# Patient Record
Sex: Female | Born: 1965 | Hispanic: No | Marital: Single | Smoking: Never smoker
Health system: Southern US, Community
[De-identification: ages and names within clinical notes are randomized; demographics above are authoritative.]

## PROBLEM LIST (undated history)

## (undated) DIAGNOSIS — M545 Low back pain, unspecified: Secondary | ICD-10-CM

## (undated) DIAGNOSIS — F419 Anxiety disorder, unspecified: Secondary | ICD-10-CM

## (undated) DIAGNOSIS — M542 Cervicalgia: Secondary | ICD-10-CM

## (undated) DIAGNOSIS — R7303 Prediabetes: Secondary | ICD-10-CM

## (undated) DIAGNOSIS — Z87440 Personal history of urinary (tract) infections: Secondary | ICD-10-CM

## (undated) DIAGNOSIS — R202 Paresthesia of skin: Secondary | ICD-10-CM

## (undated) DIAGNOSIS — I10 Essential (primary) hypertension: Secondary | ICD-10-CM

## (undated) DIAGNOSIS — I6529 Occlusion and stenosis of unspecified carotid artery: Secondary | ICD-10-CM

## (undated) DIAGNOSIS — R42 Dizziness and giddiness: Secondary | ICD-10-CM

## (undated) DIAGNOSIS — R200 Anesthesia of skin: Secondary | ICD-10-CM

## (undated) HISTORY — DX: Anesthesia of skin: R20.0

## (undated) HISTORY — DX: Anesthesia of skin: R20.2

## (undated) HISTORY — DX: Anxiety disorder, unspecified: F41.9

## (undated) HISTORY — DX: Low back pain, unspecified: M54.50

## (undated) HISTORY — DX: Cervicalgia: M54.2

## (undated) HISTORY — DX: Dizziness and giddiness: R42

## (undated) HISTORY — DX: Occlusion and stenosis of unspecified carotid artery: I65.29

## (undated) HISTORY — DX: Personal history of urinary (tract) infections: Z87.440

---

## 2001-10-23 ENCOUNTER — Other Ambulatory Visit: Admission: RE | Admit: 2001-10-23 | Discharge: 2001-10-23 | Payer: Self-pay | Admitting: Family Medicine

## 2002-05-21 ENCOUNTER — Encounter: Payer: Self-pay | Admitting: Family Medicine

## 2002-05-21 ENCOUNTER — Encounter: Admission: RE | Admit: 2002-05-21 | Discharge: 2002-05-21 | Payer: Self-pay | Admitting: Family Medicine

## 2005-01-05 ENCOUNTER — Other Ambulatory Visit: Admission: RE | Admit: 2005-01-05 | Discharge: 2005-01-05 | Payer: Self-pay | Admitting: Family Medicine

## 2007-06-13 ENCOUNTER — Other Ambulatory Visit: Admission: RE | Admit: 2007-06-13 | Discharge: 2007-06-13 | Payer: Self-pay | Admitting: Family Medicine

## 2010-03-27 ENCOUNTER — Ambulatory Visit
Admission: RE | Admit: 2010-03-27 | Discharge: 2010-03-27 | Payer: Self-pay | Source: Home / Self Care | Admitting: Emergency Medicine

## 2010-03-27 DIAGNOSIS — K089 Disorder of teeth and supporting structures, unspecified: Secondary | ICD-10-CM | POA: Insufficient documentation

## 2010-03-28 ENCOUNTER — Encounter: Payer: Self-pay | Admitting: Emergency Medicine

## 2010-04-15 NOTE — Assessment & Plan Note (Signed)
Summary: EXTREME TOOTHACHE/WSE   Vital Signs:  Patient Profile:   45 Years Old Female CC:      lower left tootache  x 4 days Height:     60 inches Weight:      156 pounds O2 Sat:      97 % O2 treatment:    Room Air Temp:     99.3 degrees F oral Pulse rate:   91 / minute Resp:     16 per minute BP sitting:   148 / 84  (left arm)  Pt. in pain?   yes    Location:   lower left teeth    Intensity:   7    Type:       aches/throb  Vitals Entered By: Lajean Saver RN (March 27, 2010 5:00 PM)                   Updated Prior Medication List: ADVIL 200 MG CAPS (IBUPROFEN) prn ZYRTEC ALLERGY 10 MG TABS (CETIRIZINE HCL) prn  Current Allergies: No known allergies History of Present Illness Chief Complaint: lower left tootache  x 4 days History of Present Illness: Lower left toothache for 4 days.  Has not seen a dentist but she has one.  Today got acutely worse.  Not trying any OTC meds.  No HA, F/C/N/V, drainage, pus.  No previous tooth pain like this.  Feels a little swollen to her.  Biting on that area or pressing it hurts the worst.  REVIEW OF SYSTEMS Constitutional Symptoms      Denies fever, chills, night sweats, weight loss, weight gain, and fatigue.  Eyes       Denies change in vision, eye pain, eye discharge, glasses, contact lenses, and eye surgery. Ear/Nose/Throat/Mouth       Complains of tooth pain or bleeding.      Denies hearing loss/aids, change in hearing, ear pain, ear discharge, dizziness, frequent runny nose, frequent nose bleeds, sinus problems, sore throat, and hoarseness.      Comments: lower left Respiratory       Denies dry cough, productive cough, wheezing, shortness of breath, asthma, bronchitis, and emphysema/COPD.  Cardiovascular       Denies murmurs, chest pain, and tires easily with exhertion.    Gastrointestinal       Denies stomach pain, nausea/vomiting, diarrhea, constipation, blood in bowel movements, and indigestion. Genitourniary  Denies painful urination, kidney stones, and loss of urinary control. Neurological       Denies paralysis, seizures, and fainting/blackouts. Musculoskeletal       Denies muscle pain, joint pain, joint stiffness, decreased range of motion, redness, swelling, muscle weakness, and gout.  Skin       Denies bruising, unusual mles/lumps or sores, and hair/skin or nail changes.  Psych       Denies mood changes, temper/anger issues, anxiety/stress, speech problems, depression, and sleep problems. Other Comments: Patient's pain has increased a lot since yesterday. Her dentist is closed on Fridays. She plans to make an appointment for next week   Past History:  Past Medical History: Unremarkable  Past Surgical History: Caesarean section  Family History: Family History Hypertension  Social History: Never Smoked Alcohol use-yes Drug use-no Smoking Status:  never Drug Use:  no Physical Exam General appearance: well developed, well nourished, no acute distress Ears: normal, no lesions or deformities Nasal: mucosa pink, nonedematous, no septal deviation, turbinates normal Oral/Pharynx: Teeth normal, no obvious abscess or lesion or broken tooth or swelling or bleeding  or drainage.  +TTP L lower 1st molar Neck: neck supple,  trachea midline, no masses Heart: regular rate and  rhythm, no murmur MSE: oriented to time, place, and person Assessment New Problems: DENTAL PAIN (ICD-525.9)   Patient Education: Patient and/or caregiver instructed in the following: rest.  Plan New Medications/Changes: VICODIN 5-500 MG TABS (HYDROCODONE-ACETAMINOPHEN) 1 by mouth at bedtime as needed severe pain  #5 x 0, 03/27/2010, Hoyt Koch MD ULTRACET 37.5-325 MG TABS (TRAMADOL-ACETAMINOPHEN) 1 by mouth q6 hrs as needed for pain  #20 x 0, 03/27/2010, Hoyt Koch MD PENICILLIN V POTASSIUM 500 MG TABS (PENICILLIN V POTASSIUM) 1 by mouth three times a day for 1 week  #21 x 0, 03/27/2010, Hoyt Koch MD  New Orders: New Patient Level III [phone] Planning Comments:   Will treat as possible dental abscess.  Take PCN VK and Ultracet.  A few Vicodin only for nighttime pain.  Avoid hot or cold or spicy foods/liquids.  Call your dentist on Monday to make an appointment to be checked out. If much more severe pain, go to ER.   The patient and/or caregiver has been counseled thoroughly with regard to medications prescribed including dosage, schedule, interactions, rationale for use, and possible side effects and they verbalize understanding.  Diagnoses and expected course of recovery discussed and will return if not improved as expected or if the condition worsens. Patient and/or caregiver verbalized understanding.  Prescriptions: VICODIN 5-500 MG TABS (HYDROCODONE-ACETAMINOPHEN) 1 by mouth at bedtime as needed severe pain  #5 x 0   Entered and Authorized by:   Hoyt Koch MD   Signed by:   Hoyt Koch MD on 03/27/2010   Method used:   Print then Give to Patient   RxID:   [id number] ULTRACET 37.5-325 MG TABS (TRAMADOL-ACETAMINOPHEN) 1 by mouth q6 hrs as needed for pain  #20 x 0   Entered and Authorized by:   Hoyt Koch MD   Signed by:   Hoyt Koch MD on 03/27/2010   Method used:   Print then Give to Patient   RxID:   [id number] PENICILLIN V POTASSIUM 500 MG TABS (PENICILLIN V POTASSIUM) 1 by mouth three times a day for 1 week  #21 x 0   Entered and Authorized by:   Hoyt Koch MD   Signed by:   Hoyt Koch MD on 03/27/2010   Method used:   Print then Give to Patient   RxID:   [id number]   Orders Added: 1)  New Patient Level III [33295]

## 2010-04-15 NOTE — Letter (Signed)
Summary: CONTROLLED RX POLICY  CONTROLLED RX POLICY   Imported By: Tawni Carnes 03/28/2010 14:16:09  _____________________________________________________________________  External Attachment:    Type:   Image     Comment:   External Document

## 2013-08-29 ENCOUNTER — Other Ambulatory Visit: Payer: Self-pay | Admitting: Obstetrics & Gynecology

## 2013-08-29 DIAGNOSIS — R928 Other abnormal and inconclusive findings on diagnostic imaging of breast: Secondary | ICD-10-CM

## 2013-09-06 ENCOUNTER — Ambulatory Visit
Admission: RE | Admit: 2013-09-06 | Discharge: 2013-09-06 | Disposition: A | Discharge status: Home / Self Care | Payer: Managed Care, Other (non HMO) | Source: Ambulatory Visit | Attending: Obstetrics & Gynecology | Admitting: Obstetrics & Gynecology

## 2013-09-06 ENCOUNTER — Encounter (INDEPENDENT_AMBULATORY_CARE_PROVIDER_SITE_OTHER): Payer: Self-pay

## 2013-09-06 DIAGNOSIS — R928 Other abnormal and inconclusive findings on diagnostic imaging of breast: Secondary | ICD-10-CM

## 2014-05-24 ENCOUNTER — Encounter (HOSPITAL_COMMUNITY): Payer: Self-pay

## 2014-05-24 ENCOUNTER — Emergency Department (HOSPITAL_COMMUNITY)
Admission: EM | Admit: 2014-05-24 | Discharge: 2014-05-24 | Disposition: A | Discharge status: Home / Self Care | Payer: Managed Care, Other (non HMO) | Attending: Emergency Medicine | Admitting: Emergency Medicine

## 2014-05-24 ENCOUNTER — Emergency Department (HOSPITAL_COMMUNITY): Payer: Managed Care, Other (non HMO)

## 2014-05-24 DIAGNOSIS — I10 Essential (primary) hypertension: Secondary | ICD-10-CM | POA: Insufficient documentation

## 2014-05-24 DIAGNOSIS — Z3202 Encounter for pregnancy test, result negative: Secondary | ICD-10-CM | POA: Insufficient documentation

## 2014-05-24 DIAGNOSIS — Z79899 Other long term (current) drug therapy: Secondary | ICD-10-CM | POA: Insufficient documentation

## 2014-05-24 DIAGNOSIS — R55 Syncope and collapse: Secondary | ICD-10-CM | POA: Insufficient documentation

## 2014-05-24 LAB — CBC WITH DIFFERENTIAL/PLATELET
BASOS ABS: 0 10*3/uL (ref 0.0–0.1)
Basophils Relative: 0 % (ref 0–1)
Eosinophils Absolute: 0.1 10*3/uL (ref 0.0–0.7)
Eosinophils Relative: 2 % (ref 0–5)
HCT: 35.5 % — ABNORMAL LOW (ref 36.0–46.0)
Hemoglobin: 11.8 g/dL — ABNORMAL LOW (ref 12.0–15.0)
Lymphocytes Relative: 17 % (ref 12–46)
Lymphs Abs: 1 10*3/uL (ref 0.7–4.0)
MCH: 30.6 pg (ref 26.0–34.0)
MCHC: 33.2 g/dL (ref 30.0–36.0)
MCV: 92 fL (ref 78.0–100.0)
MONOS PCT: 8 % (ref 3–12)
Monocytes Absolute: 0.5 10*3/uL (ref 0.1–1.0)
Neutro Abs: 4.1 10*3/uL (ref 1.7–7.7)
Neutrophils Relative %: 73 % (ref 43–77)
PLATELETS: 180 10*3/uL (ref 150–400)
RBC: 3.86 MIL/uL — ABNORMAL LOW (ref 3.87–5.11)
RDW: 13.7 % (ref 11.5–15.5)
WBC: 5.6 10*3/uL (ref 4.0–10.5)

## 2014-05-24 LAB — I-STAT CHEM 8, ED
BUN: 10 mg/dL (ref 6–23)
Calcium, Ion: 1.18 mmol/L (ref 1.12–1.23)
Chloride: 100 mmol/L (ref 96–112)
Creatinine, Ser: 1 mg/dL (ref 0.50–1.10)
GLUCOSE: 98 mg/dL (ref 70–99)
HCT: 40 % (ref 36.0–46.0)
HEMOGLOBIN: 13.6 g/dL (ref 12.0–15.0)
Potassium: 4 mmol/L (ref 3.5–5.1)
Sodium: 140 mmol/L (ref 135–145)
TCO2: 24 mmol/L (ref 0–100)

## 2014-05-24 LAB — CBG MONITORING, ED: Glucose-Capillary: 100 mg/dL — ABNORMAL HIGH (ref 70–99)

## 2014-05-24 LAB — POC URINE PREG, ED: PREG TEST UR: NEGATIVE

## 2014-05-24 MED ORDER — SODIUM CHLORIDE 0.9 % IV BOLUS (SEPSIS)
1000.0000 mL | Freq: Once | INTRAVENOUS | Status: AC
  Administered 2014-05-24: 1000 mL via INTRAVENOUS

## 2014-05-24 NOTE — ED Provider Notes (Signed)
CSN: [id number]     Arrival date & time 05/24/14  1142 History   First MD Initiated Contact with Patient 05/24/14 1211     Chief Complaint  Patient presents with  . Loss of Consciousness     (Consider location/radiation/quality/duration/timing/severity/associated sxs/prior Treatment) HPI  Brittany Fletcher is a 49 y.o. female  presenting with syncopal episode. Patient was having her hair done and felt nauseated and felt like she was going to pass out and lightheaded. She lost consciousness and the stylus witnessed this. She was gradually lowered to the floor and she noted loss of control of her bladder but no tongue biting. She states she was oriented x3 after regaining consciousness. Patient denies history of seizures or loss of consciousness. Patient states she has had URI symptoms for 1 week and reports taking too Full of cough medicine in addition to over-the-counter cold medicine. She is concerned she is to much. She denies any headaches, slurred speech, visual changes, weakness. No nausea or vomiting or abdominal pain. No back pain. She states she does not think pregnant. No chest pain or shortness of breath. No fevers or chills.   No past medical history on file. No past surgical history on file. No family history on file. History  Substance Use Topics  . Smoking status: Never Smoker   . Smokeless tobacco: Never Used  . Alcohol Use: Yes   OB History    No data available     Review of Systems 10 Systems reviewed and are negative for acute change except as noted in the HPI.    Allergies  Review of patient's allergies indicates no known allergies.  Home Medications   Prior to Admission medications   Medication Sig Start Date End Date Taking? Authorizing Provider  amLODipine (NORVASC) 5 MG tablet Take 5 mg by mouth daily. 04/30/14  Yes Historical Provider, MD  dextromethorphan (DELSYM) 30 MG/5ML liquid Take 60 mg by mouth as needed for cough.   Yes Historical Provider, MD   hydrochlorothiazide (HYDRODIURIL) 12.5 MG tablet Take 12.5 mg by mouth daily. 04/30/14  Yes Historical Provider, MD  ibuprofen (ADVIL,MOTRIN) 200 MG tablet Take 400 mg by mouth every 6 (six) hours as needed.   Yes Historical Provider, MD  irbesartan (AVAPRO) 150 MG tablet  04/30/14  Yes Historical Provider, MD  OVER THE COUNTER MEDICATION Take 2 tablets by mouth 3 (three) times daily as needed (cough and cold symptoms). Dextromethorphan, guaifenesin, phenylephrine 10-200-5   Yes Historical Provider, MD   BP 101/60 mmHg  Pulse 84  Temp(Src) 98.8 F (37.1 C) (Oral)  Resp 25  Ht 5' (1.524 m)  Wt 124 lb (56.246 kg)  BMI 24.22 kg/m2  SpO2 100%  LMP 03/14/2014 Physical Exam  Constitutional: She appears well-developed and well-nourished. No distress.  HENT:  Head: Normocephalic and atraumatic.  Mouth/Throat: Oropharynx is clear and moist.  Eyes: Conjunctivae and EOM are normal. Pupils are equal, round, and reactive to light. Right eye exhibits no discharge. Left eye exhibits no discharge.  Neck: Normal range of motion. Neck supple.  No nuchal rigidity. No neck pain  Cardiovascular: Normal rate and regular rhythm.   Pulmonary/Chest: Effort normal and breath sounds normal. No respiratory distress. She has no wheezes.  Abdominal: Soft. Bowel sounds are normal. She exhibits no distension. There is no tenderness.  Neurological: She is alert. No cranial nerve deficit. Coordination normal.  Speech is clear and goal oriented. Peripheral visual fields intact. Strength 5/5 in upper and lower extremities. Sensation intact. Intact  rapid alternating movements, finger to nose, and heel to shin. Negative Romberg. No pronator drift. Normal gait.   Skin: Skin is warm and dry. She is not diaphoretic.  Nursing note and vitals reviewed.   ED Course  Procedures (including critical care time) Labs Review Labs Reviewed  CBC WITH DIFFERENTIAL/PLATELET - Abnormal; Notable for the following:    RBC 3.86 (*)     Hemoglobin 11.8 (*)    HCT 35.5 (*)    All other components within normal limits  CBG MONITORING, ED - Abnormal; Notable for the following:    Glucose-Capillary 100 (*)    All other components within normal limits  I-STAT CHEM 8, ED  POC URINE PREG, ED    Imaging Review No results found.   EKG Interpretation   Date/Time:  Saturday May 24 2014 12:27:36 EST Ventricular Rate:  93 PR Interval:  131 QRS Duration: 75 QT Interval:  356 QTC Calculation: 443 R Axis:   75 Text Interpretation:  Sinus rhythm Probable left atrial enlargement ECG  OTHERWISE WITHIN NORMAL LIMITS No old tracing to compare Confirmed by  MILLER  MD, BRIAN ([id number]) on 05/24/2014 1:06:52 PM      MDM   Final diagnoses:  Syncope, unspecified syncope type   Patient presenting with syncopal episode. She states this occurred after she took multiple cold medications. Patient with initial hypotension BP of 82/57 which has since improved. Patient given IV fluids. Patient denies any neurological symptoms or pain anywhere. She states she is no longer having any lightheadedness. No dizziness. Patient without any focal neurological deficits. EKG without acute abnormalities. Patient with mild anemia start to start taking multivitamin. No electrolyte abnormalities. Chest x-ray without pneumonia. Patient is not pregnant. Patient ambulated and hallway without any lightheadedness symptoms and eating and drinking without difficulty in the ED. She is requesting to go home. Patient is nontoxic appearing and stable for discharge. She is to follow-up with PCP. She has been given referral to the wellness center.  Discussed return precautions with patient. Discussed all results and patient verbalizes understanding and agrees with plan.  This is a shared patient. This patient was discussed with the physician who saw and evaluated the patient and agrees with the plan.   Oswaldo ConroyVictoria Shawnise Peterkin, PA-C 05/24/14 1520  Eber HongBrian Miller, MD 05/24/14  1556

## 2014-05-24 NOTE — ED Notes (Signed)
Pt was at salon sitting under dryer, passed out.  EMS came, was told she had low BP and meds she took for cold/cough caused passing out.  Unknown LOC.  No head injury.  Pt was moved to floor by staff in salon.  Pt refused EMS.

## 2014-05-24 NOTE — Discharge Instructions (Signed)
Return to the emergency room with worsening of symptoms, new symptoms or with symptoms that are concerning , especially severe worsening of headache, visual or speech changes, weakness in face, arms or legs OR chest pain SOB. Please call your doctor for a followup appointment within 24-48 hours. When you talk to your doctor please let them know that you were seen in the emergency department and have them acquire all of your records so that they can discuss the findings with you and formulate a treatment plan to fully care for your new and ongoing problems. If you do not have a primary care provider please call the wellness center to establish care and follow up. Stop taking over-the-counter cold medications. Read below information and follow recommendations. Syncope Syncope is a medical term for fainting or passing out. This means you lose consciousness and drop to the ground. People are generally unconscious for less than 5 minutes. You may have some muscle twitches for up to 15 seconds before waking up and returning to normal. Syncope occurs more often in older adults, but it can happen to anyone. While most causes of syncope are not dangerous, syncope can be a sign of a serious medical problem. It is important to seek medical care.  CAUSES  Syncope is caused by a sudden drop in blood flow to the brain. The specific cause is often not determined. Factors that can bring on syncope include:  Taking medicines that lower blood pressure.  Sudden changes in posture, such as standing up quickly.  Taking more medicine than prescribed.  Standing in one place for too long.  Seizure disorders.  Dehydration and excessive exposure to heat.  Low blood sugar (hypoglycemia).  Straining to have a bowel movement.  Heart disease, irregular heartbeat, or other circulatory problems.  Fear, emotional distress, seeing blood, or severe pain. SYMPTOMS  Right before fainting, you may:  Feel dizzy or  light-headed.  Feel nauseous.  See all white or all black in your field of vision.  Have cold, clammy skin. DIAGNOSIS  Your health care provider will ask about your symptoms, perform a physical exam, and perform an electrocardiogram (ECG) to record the electrical activity of your heart. Your health care provider may also perform other heart or blood tests to determine the cause of your syncope which may include:  Transthoracic echocardiogram (TTE). During echocardiography, sound waves are used to evaluate how blood flows through your heart.  Transesophageal echocardiogram (TEE).  Cardiac monitoring. This allows your health care provider to monitor your heart rate and rhythm in real time.  Holter monitor. This is a portable device that records your heartbeat and can help diagnose heart arrhythmias. It allows your health care provider to track your heart activity for several days, if needed.  Stress tests by exercise or by giving medicine that makes the heart beat faster. TREATMENT  In most cases, no treatment is needed. Depending on the cause of your syncope, your health care provider may recommend changing or stopping some of your medicines. HOME CARE INSTRUCTIONS  Have someone stay with you until you feel stable.  Do not drive, use machinery, or play sports until your health care provider says it is okay.  Keep all follow-up appointments as directed by your health care provider.  Lie down right away if you start feeling like you might faint. Breathe deeply and steadily. Wait until all the symptoms have passed.  Drink enough fluids to keep your urine clear or pale yellow.  If you are  taking blood pressure or heart medicine, get up slowly and take several minutes to sit and then stand. This can reduce dizziness. SEEK IMMEDIATE MEDICAL CARE IF:   You have a severe headache.  You have unusual pain in the chest, abdomen, or back.  You are bleeding from your mouth or rectum, or you  have black or tarry stool.  You have an irregular or very fast heartbeat.  You have pain with breathing.  You have repeated fainting or seizure-like jerking during an episode.  You faint when sitting or lying down.  You have confusion.  You have trouble walking.  You have severe weakness.  You have vision problems. If you fainted, call your local emergency services (911 in [place] Do not drive yourself to the hospital.  MAKE SURE YOU:  Understand these instructions.  Will watch your condition.  Will get help right away if you are not doing well or get worse. Document Released: 02/28/2005 Document Revised: 03/05/2013 Document Reviewed: 04/29/2011 [facility] Patient Information 2015 [employer], [place]. This information is not intended to replace advice given to you by your health care provider. Make sure you discuss any questions you have with your health care provider.

## 2014-05-24 NOTE — ED Notes (Signed)
Pt took 2 pills of cold medicine that had Dextrommethorphan 1mg , Guaifenesin 200mg , and Phenylephrine 5mg  along with 2 capfulls of cough relief liquid that had Dextromethorphan Polistirex (5mL contains 30mg ).

## 2014-05-24 NOTE — ED Provider Notes (Signed)
The patient is a 49 year old female who presents after having a syncopal episode prior to arrival when she was at the hair salon getting her hair done, she felt overheated underneath the hair dryer, she had also just taken a large amount of cough medicine including both a syrup and tablets as well as phenylephrine. She felt weak, she was hypotensive, on arrival the patient received IV fluids, supportive care and evaluation, her exam is unremarkable, she feels back to normal, she has no lightheadedness, no dizziness, no shortness of breath, her lungs and heart are clear and regular with strong pulses, soft abdomen, no peripheral edema and an unremarkable neurologic exam. I suspect this was related to circumstances surrounding her overuse of medication of which she has been counseled extensively as well as the circumstances of being in a hot environment at the hair salon, at this time the patient appears stable for discharge, she will by mouth trial prior to discharge. This does not appear to be a neurologic problem, it is not a cardiac problem, EKG unremarkable.   EKG Interpretation  Date/Time:  Saturday May 24 2014 12:27:36 EST Ventricular Rate:  93 PR Interval:  131 QRS Duration: 75 QT Interval:  356 QTC Calculation: 443 R Axis:   75 Text Interpretation:  Sinus rhythm Probable left atrial enlargement ECG OTHERWISE WITHIN NORMAL LIMITS No old tracing to compare Confirmed by Cricket Goodlin  MD, Joanna Hall ([id number]) on 05/24/2014 1:06:52 PM      Medical screening examination/treatment/procedure(s) were conducted as a shared visit with non-physician practitioner(s) and myself.  I personally evaluated the patient during the encounter.  Clinical Impression:   Final diagnoses:  Syncope, unspecified syncope type         Eber HongBrian Manami Tutor, MD 05/24/14 1556

## 2018-01-21 ENCOUNTER — Encounter (HOSPITAL_COMMUNITY): Payer: Self-pay

## 2018-01-21 ENCOUNTER — Emergency Department (HOSPITAL_COMMUNITY)
Admission: EM | Admit: 2018-01-21 | Discharge: 2018-01-21 | Disposition: A | Discharge status: Home / Self Care | Payer: Managed Care, Other (non HMO) | Attending: Emergency Medicine | Admitting: Emergency Medicine

## 2018-01-21 ENCOUNTER — Emergency Department (HOSPITAL_COMMUNITY): Payer: Managed Care, Other (non HMO)

## 2018-01-21 ENCOUNTER — Other Ambulatory Visit: Payer: Self-pay

## 2018-01-21 DIAGNOSIS — R079 Chest pain, unspecified: Secondary | ICD-10-CM

## 2018-01-21 DIAGNOSIS — I10 Essential (primary) hypertension: Secondary | ICD-10-CM | POA: Insufficient documentation

## 2018-01-21 DIAGNOSIS — Z79899 Other long term (current) drug therapy: Secondary | ICD-10-CM | POA: Insufficient documentation

## 2018-01-21 HISTORY — DX: Essential (primary) hypertension: I10

## 2018-01-21 HISTORY — DX: Prediabetes: R73.03

## 2018-01-21 LAB — POCT I-STAT TROPONIN I
TROPONIN I, POC: 0 ng/mL (ref 0.00–0.08)
Troponin i, poc: 0 ng/mL (ref 0.00–0.08)

## 2018-01-21 LAB — CBC
HCT: 41.1 % (ref 36.0–46.0)
Hemoglobin: 13.2 g/dL (ref 12.0–15.0)
MCH: 29.1 pg (ref 26.0–34.0)
MCHC: 32.1 g/dL (ref 30.0–36.0)
MCV: 90.5 fL (ref 80.0–100.0)
Platelets: 236 10*3/uL (ref 150–400)
RBC: 4.54 MIL/uL (ref 3.87–5.11)
RDW: 13.1 % (ref 11.5–15.5)
WBC: 6.9 10*3/uL (ref 4.0–10.5)
nRBC: 0 % (ref 0.0–0.2)

## 2018-01-21 LAB — BASIC METABOLIC PANEL
Anion gap: 9 (ref 5–15)
BUN: 19 mg/dL (ref 6–20)
CO2: 26 mmol/L (ref 22–32)
Calcium: 9 mg/dL (ref 8.9–10.3)
Chloride: 104 mmol/L (ref 98–111)
Creatinine, Ser: 1.14 mg/dL — ABNORMAL HIGH (ref 0.44–1.00)
GFR calc non Af Amer: 54 mL/min — ABNORMAL LOW (ref >60)
Glucose, Bld: 122 mg/dL — ABNORMAL HIGH (ref 70–99)
Potassium: 3.8 mmol/L (ref 3.5–5.1)
SODIUM: 139 mmol/L (ref 135–145)

## 2018-01-21 LAB — I-STAT BETA HCG BLOOD, ED (NOT ORDERABLE): I-stat hCG, quantitative: <5 m[IU]/mL (ref <5)

## 2018-01-21 NOTE — Discharge Instructions (Addendum)
Call your doctor tomorrow to schedule a follow-up visit 

## 2018-01-21 NOTE — ED Provider Notes (Signed)
[facility]-EMERGENCY DEPT Provider Note   CSN: [id number] Arrival date & time: 01/21/18  1237     History   Chief Complaint Chief Complaint  Patient presents with  . Chest Pain    HPI Brittany Fletcher is a 52 y.o. female.  This is a 52 year old female who presents after having left-sided chest discomfort which started 24 hours ago.  Pain is atypical and localized on the left side of her chest and not associate with dizziness, diaphoresis, nausea or vomiting.  Pain waxes and wanes and was made better after taking Tums.  No fever cough or congestion.  No pleuritic chest pain.  Episodes last for a few minutes to several minutes.  No prior history of same.  Is currently asymptomatic     Past Medical History:  Diagnosis Date  . Borderline diabetic   . Hypertension     Patient Active Problem List   Diagnosis Date Noted  . DENTAL PAIN 03/27/2010    History reviewed. No pertinent surgical history.   OB History   None      Home Medications    Prior to Admission medications   Medication Sig Start Date End Date Taking? Authorizing Provider  amLODipine (NORVASC) 5 MG tablet Take 5 mg by mouth daily. 04/30/14   [provider]  dextromethorphan (DELSYM) 30 MG/5ML liquid Take 60 mg by mouth as needed for cough.    [provider]  hydrochlorothiazide (HYDRODIURIL) 12.5 MG tablet Take 12.5 mg by mouth daily. 04/30/14   [provider]  ibuprofen (ADVIL,MOTRIN) 200 MG tablet Take 400 mg by mouth every 6 (six) hours as needed.    [provider]  irbesartan (AVAPRO) 150 MG tablet  04/30/14   [provider]  OVER THE COUNTER MEDICATION Take 2 tablets by mouth 3 (three) times daily as needed (cough and cold symptoms). Dextromethorphan, guaifenesin, phenylephrine 10-200-5    [provider]    Family History No family history on file.  Social History Social History   Tobacco Use  . Smoking status: Never  Smoker  . Smokeless tobacco: Never Used  Substance Use Topics  . Alcohol use: Yes    Comment: very rarely  . Drug use: Never     Allergies   Patient has no known allergies.   Review of Systems Review of Systems  All other systems reviewed and are negative.    Physical Exam Updated Vital Signs BP 129/85 (BP Location: Right Arm)   Pulse 95   Temp 97.9 F (36.6 C) (Oral)   Resp (!) 22   Ht 1.524 m (5')   Wt 74.8 kg   LMP 03/14/2014   SpO2 98%   BMI 32.22 kg/m   Physical Exam  Constitutional: She is oriented to person, place, and time. She appears well-developed and well-nourished.  Non-toxic appearance. No distress.  HENT:  Head: Normocephalic and atraumatic.  Eyes: Pupils are equal, round, and reactive to light. Conjunctivae, EOM and lids are normal.  Neck: Normal range of motion. Neck supple. No tracheal deviation present. No thyroid mass present.  Cardiovascular: Normal rate, regular rhythm and normal heart sounds. Exam reveals no gallop.  No murmur heard. Pulmonary/Chest: Effort normal and breath sounds normal. No stridor. No respiratory distress. She has no decreased breath sounds. She has no wheezes. She has no rhonchi. She has no rales.  Abdominal: Soft. Normal appearance and bowel sounds are normal. She exhibits no distension. There is no tenderness. There is no rebound and  no CVA tenderness.  Musculoskeletal: Normal range of motion. She exhibits no edema or tenderness.  Neurological: She is alert and oriented to person, place, and time. She has normal strength. No cranial nerve deficit or sensory deficit. GCS eye subscore is 4. GCS verbal subscore is 5. GCS motor subscore is 6.  Skin: Skin is warm and dry. No abrasion and no rash noted.  Psychiatric: She has a normal mood and affect. Her speech is normal and behavior is normal.  Nursing note and vitals reviewed.    ED Treatments / Results  Labs (all labs ordered are listed, but only abnormal results are  displayed) Labs Reviewed  BASIC METABOLIC PANEL - Abnormal; Notable for the following components:      Result Value   Glucose, Bld 122 (*)    Creatinine, Ser 1.14 (*)    GFR calc non Af Amer 54 (*)    All other components within normal limits  CBC  I-STAT TROPONIN, ED  I-STAT BETA HCG BLOOD, ED ([facility], [facility], [facility] ONLY)  POCT I-STAT TROPONIN I  I-STAT BETA HCG BLOOD, ED (NOT ORDERABLE)    EKG EKG Interpretation  Date/Time:  Sunday January 21 2018 12:48:06 EST Ventricular Rate:  95 PR Interval:    QRS Duration: 74 QT Interval:  352 QTC Calculation: 443 R Axis:   54 Text Interpretation:  Sinus rhythm Probable left atrial enlargement similar to prior 3/19 Confirmed by Meridee Score [phone]) on 01/21/2018 1:26:43 PM Also confirmed by Meridee Score [phone]), editor Barbette Hair [phone])  on 01/21/2018 2:31:13 PM   Radiology Dg Chest 2 View  Result Date: 01/21/2018 CLINICAL DATA:  Pain to LEFT upper quadrant of chest. Controlled hypertension. EXAM: CHEST - 2 VIEW COMPARISON:  05/24/2014. FINDINGS: Low lung volumes. Clear lung fields. Normal cardiomediastinal silhouette. No bony abnormality. IMPRESSION: No active cardiopulmonary disease. Electronically Signed   By: Elsie Stain M.D.   On: 01/21/2018 13:38    Procedures Procedures (including critical care time)  Medications Ordered in ED Medications - No data to display   Initial Impression / Assessment and Plan / ED Course  I have reviewed the triage vital signs and the nursing notes.  Pertinent labs & imaging results that were available during my care of the patient were reviewed by me and considered in my medical decision making (see chart for details).     Patient has a heart score of 2.  Her EKG does not show any signs of ischemic changes.  Her delta troponin was negative.  Pain is very atypical.  Low suspicion for ACS or pulmonary embolism.  Return precautions given.  Final Clinical Impressions(s) / ED Diagnoses   Final  diagnoses:  None    ED Discharge Orders    None       Lorre Nick, MD 01/21/18 [phone]

## 2018-01-21 NOTE — ED Triage Notes (Addendum)
Pt states she has been having chest pain for the last 24 hours. Pt states pain is in her neck and left chest, and describes it as dull. Pt also states she has been having some indigestion.

## 2018-12-28 ENCOUNTER — Ambulatory Visit: Payer: Self-pay | Admitting: Family Medicine

## 2019-02-15 ENCOUNTER — Ambulatory Visit: Payer: Self-pay | Admitting: Internal Medicine

## 2019-04-12 ENCOUNTER — Other Ambulatory Visit: Payer: Self-pay | Admitting: Family Medicine

## 2019-04-12 ENCOUNTER — Other Ambulatory Visit (HOSPITAL_COMMUNITY)
Admission: RE | Admit: 2019-04-12 | Discharge: 2019-04-12 | Disposition: A | Discharge status: Home / Self Care | Payer: BC Managed Care – PPO | Source: Ambulatory Visit | Attending: Family Medicine | Admitting: Family Medicine

## 2019-04-12 DIAGNOSIS — E781 Pure hyperglyceridemia: Secondary | ICD-10-CM | POA: Diagnosis not present

## 2019-04-12 DIAGNOSIS — R7309 Other abnormal glucose: Secondary | ICD-10-CM | POA: Diagnosis not present

## 2019-04-12 DIAGNOSIS — F4321 Adjustment disorder with depressed mood: Secondary | ICD-10-CM | POA: Diagnosis not present

## 2019-04-12 DIAGNOSIS — Z124 Encounter for screening for malignant neoplasm of cervix: Secondary | ICD-10-CM | POA: Insufficient documentation

## 2019-04-12 DIAGNOSIS — Z1212 Encounter for screening for malignant neoplasm of rectum: Secondary | ICD-10-CM | POA: Diagnosis not present

## 2019-04-12 DIAGNOSIS — Z01419 Encounter for gynecological examination (general) (routine) without abnormal findings: Secondary | ICD-10-CM | POA: Diagnosis not present

## 2019-04-12 DIAGNOSIS — I10 Essential (primary) hypertension: Secondary | ICD-10-CM | POA: Diagnosis not present

## 2019-04-18 ENCOUNTER — Other Ambulatory Visit: Payer: Self-pay | Admitting: Family Medicine

## 2019-04-18 DIAGNOSIS — Z1231 Encounter for screening mammogram for malignant neoplasm of breast: Secondary | ICD-10-CM

## 2019-04-18 LAB — CYTOLOGY - PAP
Adequacy: ABSENT
Comment: NEGATIVE
Diagnosis: NEGATIVE
High risk HPV: NEGATIVE

## 2019-04-26 DIAGNOSIS — S01511A Laceration without foreign body of lip, initial encounter: Secondary | ICD-10-CM | POA: Diagnosis not present

## 2019-04-26 DIAGNOSIS — W540XXA Bitten by dog, initial encounter: Secondary | ICD-10-CM | POA: Diagnosis not present

## 2019-04-26 DIAGNOSIS — Z23 Encounter for immunization: Secondary | ICD-10-CM | POA: Diagnosis not present

## 2019-05-03 DIAGNOSIS — E785 Hyperlipidemia, unspecified: Secondary | ICD-10-CM | POA: Diagnosis not present

## 2019-05-03 DIAGNOSIS — I10 Essential (primary) hypertension: Secondary | ICD-10-CM | POA: Diagnosis not present

## 2019-05-03 DIAGNOSIS — R7309 Other abnormal glucose: Secondary | ICD-10-CM | POA: Diagnosis not present

## 2019-05-29 DIAGNOSIS — F4321 Adjustment disorder with depressed mood: Secondary | ICD-10-CM | POA: Diagnosis not present

## 2019-06-26 DIAGNOSIS — F4321 Adjustment disorder with depressed mood: Secondary | ICD-10-CM | POA: Diagnosis not present

## 2019-07-10 DIAGNOSIS — F4321 Adjustment disorder with depressed mood: Secondary | ICD-10-CM | POA: Diagnosis not present

## 2019-07-27 DIAGNOSIS — Z124 Encounter for screening for malignant neoplasm of cervix: Secondary | ICD-10-CM | POA: Diagnosis not present

## 2019-07-27 DIAGNOSIS — F064 Anxiety disorder due to known physiological condition: Secondary | ICD-10-CM | POA: Diagnosis not present

## 2019-07-27 DIAGNOSIS — F4321 Adjustment disorder with depressed mood: Secondary | ICD-10-CM | POA: Diagnosis not present

## 2019-07-27 DIAGNOSIS — I10 Essential (primary) hypertension: Secondary | ICD-10-CM | POA: Diagnosis not present

## 2019-07-27 DIAGNOSIS — R7309 Other abnormal glucose: Secondary | ICD-10-CM | POA: Diagnosis not present

## 2019-07-27 DIAGNOSIS — E782 Mixed hyperlipidemia: Secondary | ICD-10-CM | POA: Diagnosis not present

## 2019-07-27 DIAGNOSIS — E781 Pure hyperglyceridemia: Secondary | ICD-10-CM | POA: Diagnosis not present

## 2019-07-27 DIAGNOSIS — I110 Hypertensive heart disease with heart failure: Secondary | ICD-10-CM | POA: Diagnosis not present

## 2019-08-27 DIAGNOSIS — F4321 Adjustment disorder with depressed mood: Secondary | ICD-10-CM | POA: Diagnosis not present

## 2019-09-10 DIAGNOSIS — F4321 Adjustment disorder with depressed mood: Secondary | ICD-10-CM | POA: Diagnosis not present

## 2019-10-21 DIAGNOSIS — F4321 Adjustment disorder with depressed mood: Secondary | ICD-10-CM | POA: Diagnosis not present

## 2019-11-07 DIAGNOSIS — F4321 Adjustment disorder with depressed mood: Secondary | ICD-10-CM | POA: Diagnosis not present

## 2019-11-16 DIAGNOSIS — I10 Essential (primary) hypertension: Secondary | ICD-10-CM | POA: Diagnosis not present

## 2019-11-16 DIAGNOSIS — R7309 Other abnormal glucose: Secondary | ICD-10-CM | POA: Diagnosis not present

## 2019-11-16 DIAGNOSIS — E782 Mixed hyperlipidemia: Secondary | ICD-10-CM | POA: Diagnosis not present

## 2019-11-16 DIAGNOSIS — F4321 Adjustment disorder with depressed mood: Secondary | ICD-10-CM | POA: Diagnosis not present

## 2019-11-16 DIAGNOSIS — R739 Hyperglycemia, unspecified: Secondary | ICD-10-CM | POA: Diagnosis not present

## 2020-02-05 ENCOUNTER — Other Ambulatory Visit: Payer: Self-pay

## 2020-02-05 ENCOUNTER — Ambulatory Visit
Admission: RE | Admit: 2020-02-05 | Discharge: 2020-02-05 | Disposition: A | Discharge status: Home / Self Care | Payer: BC Managed Care – PPO | Source: Ambulatory Visit | Attending: Family Medicine | Admitting: Family Medicine

## 2020-02-05 DIAGNOSIS — Z1231 Encounter for screening mammogram for malignant neoplasm of breast: Secondary | ICD-10-CM | POA: Diagnosis not present

## 2020-02-29 DIAGNOSIS — F411 Generalized anxiety disorder: Secondary | ICD-10-CM | POA: Diagnosis not present

## 2020-02-29 DIAGNOSIS — E785 Hyperlipidemia, unspecified: Secondary | ICD-10-CM | POA: Diagnosis not present

## 2020-02-29 DIAGNOSIS — I10 Essential (primary) hypertension: Secondary | ICD-10-CM | POA: Diagnosis not present

## 2020-02-29 DIAGNOSIS — Z72811 Adult antisocial behavior: Secondary | ICD-10-CM | POA: Diagnosis not present

## 2020-04-07 DIAGNOSIS — Z01419 Encounter for gynecological examination (general) (routine) without abnormal findings: Secondary | ICD-10-CM | POA: Diagnosis not present

## 2020-04-07 DIAGNOSIS — Z113 Encounter for screening for infections with a predominantly sexual mode of transmission: Secondary | ICD-10-CM | POA: Diagnosis not present

## 2020-04-07 DIAGNOSIS — Z6833 Body mass index (BMI) 33.0-33.9, adult: Secondary | ICD-10-CM | POA: Diagnosis not present

## 2020-04-14 DIAGNOSIS — R635 Abnormal weight gain: Secondary | ICD-10-CM | POA: Diagnosis not present

## 2020-04-14 DIAGNOSIS — R739 Hyperglycemia, unspecified: Secondary | ICD-10-CM | POA: Diagnosis not present

## 2020-04-14 DIAGNOSIS — E785 Hyperlipidemia, unspecified: Secondary | ICD-10-CM | POA: Diagnosis not present

## 2020-04-14 DIAGNOSIS — I10 Essential (primary) hypertension: Secondary | ICD-10-CM | POA: Diagnosis not present

## 2020-05-07 DIAGNOSIS — R200 Anesthesia of skin: Secondary | ICD-10-CM | POA: Diagnosis not present

## 2020-05-20 DIAGNOSIS — R200 Anesthesia of skin: Secondary | ICD-10-CM | POA: Diagnosis not present

## 2020-05-22 DIAGNOSIS — I10 Essential (primary) hypertension: Secondary | ICD-10-CM | POA: Diagnosis not present

## 2020-05-22 DIAGNOSIS — F5102 Adjustment insomnia: Secondary | ICD-10-CM | POA: Diagnosis not present

## 2020-05-22 DIAGNOSIS — F4321 Adjustment disorder with depressed mood: Secondary | ICD-10-CM | POA: Diagnosis not present

## 2020-05-28 DIAGNOSIS — M25521 Pain in right elbow: Secondary | ICD-10-CM | POA: Diagnosis not present

## 2020-05-28 DIAGNOSIS — M25522 Pain in left elbow: Secondary | ICD-10-CM | POA: Diagnosis not present

## 2020-05-28 DIAGNOSIS — R200 Anesthesia of skin: Secondary | ICD-10-CM | POA: Diagnosis not present

## 2020-06-04 ENCOUNTER — Other Ambulatory Visit: Payer: Self-pay

## 2020-06-13 ENCOUNTER — Other Ambulatory Visit: Payer: Self-pay

## 2020-06-13 DIAGNOSIS — I6529 Occlusion and stenosis of unspecified carotid artery: Secondary | ICD-10-CM

## 2020-06-25 ENCOUNTER — Ambulatory Visit: Payer: BC Managed Care – PPO | Admitting: Vascular Surgery

## 2020-06-25 ENCOUNTER — Ambulatory Visit (HOSPITAL_COMMUNITY)
Admission: RE | Admit: 2020-06-25 | Discharge: 2020-06-25 | Disposition: A | Discharge status: Home / Self Care | Payer: BC Managed Care – PPO | Source: Ambulatory Visit | Attending: Vascular Surgery | Admitting: Vascular Surgery

## 2020-06-25 ENCOUNTER — Other Ambulatory Visit: Payer: Self-pay

## 2020-06-25 ENCOUNTER — Encounter: Payer: Self-pay | Admitting: Vascular Surgery

## 2020-06-25 VITALS — BP 113/75 | HR 81 | Temp 98.2°F | Resp 20 | Ht 60.0 in | Wt 167.0 lb

## 2020-06-25 DIAGNOSIS — I6529 Occlusion and stenosis of unspecified carotid artery: Secondary | ICD-10-CM | POA: Diagnosis not present

## 2020-06-25 DIAGNOSIS — I739 Peripheral vascular disease, unspecified: Secondary | ICD-10-CM

## 2020-06-25 MED ORDER — ROSUVASTATIN CALCIUM 20 MG PO TABS: 20.0000 mg | ORAL_TABLET | Freq: Every day | ORAL | 11 refills | Status: AC

## 2020-06-25 NOTE — Progress Notes (Signed)
Referring Physician: Dr. Shelle Iron  Patient name: Brittany Fletcher MRN: [id number] DOB: 1965-11-02 Sex: female  REASON FOR CONSULT: Left carotid stenosis  HPI: Brittany Fletcher is a 55 y.o. female, who sustained a fall at work recently.  As part of her work-up for left shoulder pain she had a MRI of the neck which showed possible 50% left internal carotid artery stenosis.  She has no prior history of TIA amaurosis or stroke.  She has no family history of early strokes.  Her father and mother both did have atherosclerotic disease but in their 16s and both were smokers.  She is a non-smoker.  She does not have diabetes.  She does have a history of hypertension.  She checks her blood pressure at home and usually is systolic in the 130s.  She is not currently on aspirin.  She does not really have any GI symptoms but states she has a very sensitive stomach to some foods.  She states she has had borderline elevated cholesterol in the past.   Past Medical History:  Diagnosis Date  . Anxiety disorder   . Borderline diabetic   . Cervical spine pain   . Cervicalgia   . Dizziness   . H/O urinary infection   . Hypertension   . Hypertension   . Internal carotid artery stenosis   . Lumbar spine pain   . Numbness and tingling    Past Surgical History:  Procedure Laterality Date  . CESAREAN SECTION      History reviewed. No pertinent family history.  SOCIAL HISTORY: Social History   Socioeconomic History  . Marital status: Single    Spouse name: Not on file  . Number of children: Not on file  . Years of education: Not on file  . Highest education level: Not on file  Occupational History  . Not on file  Tobacco Use  . Smoking status: Never Smoker  . Smokeless tobacco: Never Used  Vaping Use  . Vaping Use: Never used  Substance and Sexual Activity  . Alcohol use: Yes    Comment: very rarely  . Drug use: Never  . Sexual activity: Not on file  Other Topics Concern  . Not on file  Social  History Narrative  . Not on file   Social Determinants of Health   Financial Resource Strain: Not on file  Food Insecurity: Not on file  Transportation Needs: Not on file  Physical Activity: Not on file  Stress: Not on file  Social Connections: Not on file  Intimate Partner Violence: Not on file    No Known Allergies   Medications: Diovan HCT 160/12.5   ROS:   General:  No weight loss, Fever, chills  HEENT: No recent headaches, no nasal bleeding, no visual changes, no sore throat  Neurologic: No dizziness, blackouts, seizures. No recent symptoms of stroke or mini- stroke. No recent episodes of slurred speech, or temporary blindness.  Cardiac: No recent episodes of chest pain/pressure, no shortness of breath at rest.  No shortness of breath with exertion.  Denies history of atrial fibrillation or irregular heartbeat  Vascular: No history of rest pain in feet.  No history of claudication.  No history of non-healing ulcer, No history of DVT   Pulmonary: No home oxygen, no productive cough, no hemoptysis,  No asthma or wheezing  Musculoskeletal:  [ ]  Arthritis, [ ]  Low back pain,  [X]  Joint pain  Hematologic:No history of hypercoagulable state.  No history of easy bleeding.  No history of anemia  Gastrointestinal: No hematochezia or melena,  No gastroesophageal reflux, no trouble swallowing  Urinary: [ ]  chronic Kidney disease, [ ]  on HD - [ ]  MWF or [ ]  TTHS, [ ]  Burning with urination, [ ]  Frequent urination, [ ]  Difficulty urinating;   Skin: No rashes  Psychological: No history of anxiety,  No history of depression   Physical Examination  Vitals:   06/25/20 1432 06/25/20 1434  BP: 110/76 113/75  Pulse: 81   Resp: 20   Temp: 98.2 F (36.8 C)   SpO2: 95%   Weight: 167 lb (75.8 kg)   Height: 5' (1.524 m)     Body mass index is 32.61 kg/m.  General:  Alert and oriented, no acute distress HEENT: Normal Neck: No JVD Cardiac: Regular Rate and  Rhythm Abdomen: Soft, non-tender, non-distended, no mass Skin: No rash Extremity Pulses:  2+ radial, brachial, femoral, dorsalis pedis, posterior tibial pulses bilaterally Musculoskeletal: No deformity or edema  Neurologic: Upper and lower extremity motor 5/5 and symmetric  DATA:  Patient had a bilateral carotid duplex exam today which showed less than 40% stenosis bilaterally antegrade vertebral flow no subclavian artery stenosis  I reviewed the patient's MRI report which suggested left internal carotid artery stenosis of 50% and hypoplastic left vertebral artery  ASSESSMENT: Atherosclerosis bilateral carotid arteries no significant stenosis   PLAN: Patient needs follow-up carotid duplex scan 2 to 3 years or sooner if she develops symptoms  In light of her early atherosclerosis pattern I believe she would probably benefit from being placed on a statin at this point.  She was given a prescription today for Crestor 20 mg daily with 11 refills.    I did not start her on aspirin as she is not currently having any symptoms and does have some underlying abdominal pain with certain foods.  Certainly if she develops any symptoms I would have a very low threshold to start her on an aspirin.   , MD [facility] Office: [phone]

## 2020-07-04 DIAGNOSIS — Z6379 Other stressful life events affecting family and household: Secondary | ICD-10-CM | POA: Diagnosis not present

## 2020-07-04 DIAGNOSIS — I10 Essential (primary) hypertension: Secondary | ICD-10-CM | POA: Diagnosis not present

## 2020-07-04 DIAGNOSIS — F064 Anxiety disorder due to known physiological condition: Secondary | ICD-10-CM | POA: Diagnosis not present

## 2020-07-04 DIAGNOSIS — R739 Hyperglycemia, unspecified: Secondary | ICD-10-CM | POA: Diagnosis not present

## 2020-12-07 DIAGNOSIS — F4321 Adjustment disorder with depressed mood: Secondary | ICD-10-CM | POA: Diagnosis not present

## 2020-12-28 DIAGNOSIS — F4321 Adjustment disorder with depressed mood: Secondary | ICD-10-CM | POA: Diagnosis not present

## 2021-03-25 ENCOUNTER — Other Ambulatory Visit: Payer: Self-pay | Admitting: Family Medicine

## 2021-03-25 DIAGNOSIS — Z1231 Encounter for screening mammogram for malignant neoplasm of breast: Secondary | ICD-10-CM

## 2021-04-09 ENCOUNTER — Ambulatory Visit: Payer: BC Managed Care – PPO

## 2021-04-20 ENCOUNTER — Other Ambulatory Visit: Payer: Self-pay

## 2021-04-20 ENCOUNTER — Ambulatory Visit
Admission: RE | Admit: 2021-04-20 | Discharge: 2021-04-20 | Disposition: A | Discharge status: Home / Self Care | Payer: BC Managed Care – PPO | Source: Ambulatory Visit | Attending: Family Medicine | Admitting: Family Medicine

## 2021-04-20 DIAGNOSIS — Z1231 Encounter for screening mammogram for malignant neoplasm of breast: Secondary | ICD-10-CM

## 2022-05-11 ENCOUNTER — Other Ambulatory Visit: Payer: Self-pay | Admitting: Family Medicine

## 2022-05-11 DIAGNOSIS — Z1231 Encounter for screening mammogram for malignant neoplasm of breast: Secondary | ICD-10-CM

## 2022-06-29 ENCOUNTER — Ambulatory Visit
Admission: RE | Admit: 2022-06-29 | Discharge: 2022-06-29 | Disposition: A | Discharge status: Home / Self Care | Payer: BC Managed Care – PPO | Source: Ambulatory Visit | Attending: Family Medicine | Admitting: Family Medicine

## 2022-06-29 DIAGNOSIS — Z1231 Encounter for screening mammogram for malignant neoplasm of breast: Secondary | ICD-10-CM

## 2022-08-08 ENCOUNTER — Ambulatory Visit
Admission: EM | Admit: 2022-08-08 | Discharge: 2022-08-08 | Disposition: A | Discharge status: Home / Self Care | Payer: BC Managed Care – PPO | Attending: Internal Medicine | Admitting: Internal Medicine

## 2022-08-08 DIAGNOSIS — J069 Acute upper respiratory infection, unspecified: Secondary | ICD-10-CM | POA: Insufficient documentation

## 2022-08-08 DIAGNOSIS — Z1152 Encounter for screening for COVID-19: Secondary | ICD-10-CM | POA: Insufficient documentation

## 2022-08-08 DIAGNOSIS — J029 Acute pharyngitis, unspecified: Secondary | ICD-10-CM

## 2022-08-08 LAB — POCT RAPID STREP A (OFFICE): Rapid Strep A Screen: NEGATIVE

## 2022-08-08 MED ORDER — FLUTICASONE PROPIONATE 50 MCG/ACT NA SUSP: 1.0000 | Freq: Every day | NASAL | 0 refills | Status: AC

## 2022-08-08 MED ORDER — CHLORASEPTIC 1.4 % MT LIQD: 1.0000 | OROMUCOSAL | 0 refills | Status: DC | PRN

## 2022-08-08 MED ORDER — BENZONATATE 100 MG PO CAPS: 100.0000 mg | ORAL_CAPSULE | Freq: Three times a day (TID) | ORAL | 0 refills | Status: DC | PRN

## 2022-08-08 NOTE — ED Triage Notes (Signed)
Pt states that she has a sore throat, cough, and nasal congestion. X4 days

## 2022-08-08 NOTE — Discharge Instructions (Signed)
Rapid strep is negative.  Throat culture and COVID test pending.  Will call if they are positive.  Suspect that you have a viral illness that should run its course as we discussed.  I have prescribed you 3 medications to help alleviate symptoms.  Ensure adequate fluid hydration and rest.  Follow-up if any symptoms persist or worsen.

## 2022-08-08 NOTE — ED Provider Notes (Signed)
[facility]    CSN: [id number] Arrival date & time: 08/08/22  0836      History   Chief Complaint Chief Complaint  Patient presents with   Sore Throat    X4 days Sore throat, cough and nasal congestion    HPI Brittany Fletcher is a 57 y.o. female.   Patient presents with sore throat, cough, nasal congestion that started about 4 days ago.  Patient reports sore throat seems to be most prominent symptom and cough developed a few days after sore throat.  She denies any known sick contacts or fever.  She reports that she had bodyaches and chills at start of symptoms which is now resolved.  She took an over-the-counter cold and flu medication with some improvement in symptoms.  Denies history of asthma or COPD and patient does not smoke cigarettes.  Patient denies chest pain or shortness of breath.   Sore Throat    Past Medical History:  Diagnosis Date   Anxiety disorder    Borderline diabetic    Cervical spine pain    Cervicalgia    Dizziness    H/O urinary infection    Hypertension    Hypertension    Internal carotid artery stenosis    Lumbar spine pain    Numbness and tingling     Patient Active Problem List   Diagnosis Date Noted   DENTAL PAIN 03/27/2010    Past Surgical History:  Procedure Laterality Date   CESAREAN SECTION      OB History   No obstetric history on file.      Home Medications    Prior to Admission medications   Medication Sig Start Date End Date Taking? Authorizing Provider  benzonatate (TESSALON) 100 MG capsule Take 1 capsule (100 mg total) by mouth every 8 (eight) hours as needed for cough. 08/08/22  Yes Mei Suits, Rolly Salter E, FNP  fluticasone (FLONASE) 50 MCG/ACT nasal spray Place 1 spray into both nostrils daily. 08/08/22  Yes Lorretta Kerce, Rolly Salter E, FNP  phenol (CHLORASEPTIC) 1.4 % LIQD Use as directed 1 spray in the mouth or throat as needed for throat irritation / pain. 08/08/22  Yes Myron Stankovich, Rolly Salter E, FNP  rosuvastatin (CRESTOR) 20 MG  tablet Take 1 tablet (20 mg total) by mouth daily. 06/25/20  Yes Fields, Janetta Hora, MD  valsartan-hydrochlorothiazide (DIOVAN-HCT) 160-12.5 MG tablet Take 1 tablet by mouth daily. 04/12/19  Yes [provider]  amLODipine (NORVASC) 5 MG tablet Take 5 mg by mouth daily. 04/30/14   [provider]  citalopram (CELEXA) 10 MG tablet TAKE 1 TABLET BY MOUTH EVERY DAY FOR 10 DAYS,THEN INCREASE TO 2 TABLETS DAILY 05/22/20   [provider]  cyclobenzaprine (FLEXERIL) 5 MG tablet Take by mouth. 02/17/20   [provider]  diclofenac (VOLTAREN) 75 MG EC tablet TAKE 1 TABLET (75 MG TOTAL) BY MOUTH 2 (TWO) TIMES A DAY FOR 10 DAYS. DO NOT CRUSH, CHEW, OR SPLIT. 02/17/20   [provider]  gabapentin (NEURONTIN) 300 MG capsule Take 1 capsule by mouth as directed  TAKE ONE TABLET DAILY FOR ONE WEEK; THEN TWO TABLETS DAILY FOR ONE WEEK; THEN THREE TIMES DAILY 05/07/20   [provider]  hydrochlorothiazide (HYDRODIURIL) 12.5 MG tablet Take 12.5 mg by mouth at bedtime.  04/30/14   [provider]  hydrOXYzine (VISTARIL) 25 MG capsule Take 25 mg by mouth 2 (two) times daily. 05/29/20   [provider]  irbesartan (AVAPRO) 150 MG tablet Take 150 mg  by mouth at bedtime.  04/30/14   [provider]  lisinopril (ZESTRIL) 10 MG tablet Take by mouth.    [provider]  meloxicam (MOBIC) 7.5 MG tablet Take 7.5 mg by mouth daily. 05/29/20   [provider]  methocarbamol (ROBAXIN) 500 MG tablet Take 500 mg by mouth at bedtime. 05/29/20   [provider]  valACYclovir (VALTREX) 500 MG tablet Take 500 mg by mouth 2 (two) times daily. 04/07/20   [provider]    Family History Family History  Problem Relation Age of Onset   Breast cancer Neg Hx     Social History Social History   Tobacco Use   Smoking status: Never   Smokeless tobacco: Never  Vaping Use   Vaping Use: Never used  Substance Use Topics   Alcohol  use: Not Currently    Comment: very rarely   Drug use: Never     Allergies   Patient has no known allergies.   Review of Systems Review of Systems Per HPI  Physical Exam Triage Vital Signs ED Triage Vitals  Enc Vitals Group     BP 08/08/22 0850 130/88     Pulse Rate 08/08/22 0850 88     Resp 08/08/22 0850 18     Temp 08/08/22 0850 98.2 F (36.8 C)     Temp Source 08/08/22 0850 Oral     SpO2 08/08/22 0850 95 %     Weight 08/08/22 0847 165 lb (74.8 kg)     Height 08/08/22 0847 5' (1.524 m)     Head Circumference --      Peak Flow --      Pain Score 08/08/22 0847 2     Pain Loc --      Pain Edu? --      Excl. in GC? --    No data found.  Updated Vital Signs BP 130/88 (BP Location: Left Arm)   Pulse 88   Temp 98.2 F (36.8 C) (Oral)   Resp 18   Ht 5' (1.524 m)   Wt 165 lb (74.8 kg)   LMP 03/14/2014   SpO2 95%   BMI 32.22 kg/m   Visual Acuity Right Eye Distance:   Left Eye Distance:   Bilateral Distance:    Right Eye Near:   Left Eye Near:    Bilateral Near:     Physical Exam Constitutional:      General: She is not in acute distress.    Appearance: Normal appearance. She is not toxic-appearing or diaphoretic.  HENT:     Head: Normocephalic and atraumatic.     Right Ear: Tympanic membrane and ear canal normal.     Left Ear: Tympanic membrane and ear canal normal.     Nose: Congestion present.     Mouth/Throat:     Mouth: Mucous membranes are moist.     Pharynx: Posterior oropharyngeal erythema present.  Eyes:     Extraocular Movements: Extraocular movements intact.     Conjunctiva/sclera: Conjunctivae normal.     Pupils: Pupils are equal, round, and reactive to light.  Cardiovascular:     Rate and Rhythm: Normal rate and regular rhythm.     Pulses: Normal pulses.     Heart sounds: Normal heart sounds.  Pulmonary:     Effort: Pulmonary effort is normal. No respiratory distress.     Breath sounds: Normal breath sounds. No stridor. No wheezing,  rhonchi or rales.  Abdominal:     General: Abdomen  is flat. Bowel sounds are normal.     Palpations: Abdomen is soft.  Musculoskeletal:        General: Normal range of motion.     Cervical back: Normal range of motion.  Skin:    General: Skin is warm and dry.  Neurological:     General: No focal deficit present.     Mental Status: She is alert and oriented to person, place, and time. Mental status is at baseline.  Psychiatric:        Mood and Affect: Mood normal.        Behavior: Behavior normal.      UC Treatments / Results  Labs (all labs ordered are listed, but only abnormal results are displayed) Labs Reviewed  CULTURE, GROUP A STREP ([facility])  SARS CORONAVIRUS 2 (TAT 6-24 HRS)  POCT RAPID STREP A (OFFICE)    EKG   Radiology No results found.  Procedures Procedures (including critical care time)  Medications Ordered in UC Medications - No data to display  Initial Impression / Assessment and Plan / UC Course  I have reviewed the triage vital signs and the nursing notes.  Pertinent labs & imaging results that were available during my care of the patient were reviewed by me and considered in my medical decision making (see chart for details).     Patient presents with symptoms likely from a viral upper respiratory infection.  Do not suspect underlying cardiopulmonary process. Symptoms seem unlikely related to ACS, CHF or COPD exacerbations, pneumonia, pneumothorax. Patient is nontoxic appearing and not in need of emergent medical intervention. Rapid strep was negative. Throat culture and covid test pending.   Recommended symptom control with over the counter medications.  Patient was sent benzonatate, Flonase, Chloraseptic spray.  Return if symptoms fail to improve in 1-2 weeks or you develop shortness of breath, chest pain, severe headache. Patient states understanding and is agreeable.  Discharged with PCP followup.  Final Clinical Impressions(s) / UC Diagnoses    Final diagnoses:  Viral upper respiratory tract infection with cough  Sore throat     Discharge Instructions      Rapid strep is negative.  Throat culture and COVID test pending.  Will call if they are positive.  Suspect that you have a viral illness that should run its course as we discussed.  I have prescribed you 3 medications to help alleviate symptoms.  Ensure adequate fluid hydration and rest.  Follow-up if any symptoms persist or worsen.     ED Prescriptions     Medication Sig Dispense Auth. Provider   fluticasone (FLONASE) 50 MCG/ACT nasal spray Place 1 spray into both nostrils daily. 16 g Tylisa Alcivar, Rolly Salter E, [place]   phenol (CHLORASEPTIC) 1.4 % LIQD Use as directed 1 spray in the mouth or throat as needed for throat irritation / pain. 118 mL Jamayah Myszka, Harts E, [place]   benzonatate (TESSALON) 100 MG capsule Take 1 capsule (100 mg total) by mouth every 8 (eight) hours as needed for cough. 21 capsule [place], Acie Fredrickson, [place]      PDMP not reviewed this encounter.   Gustavus Bryant, [place] 08/08/22 [phone]

## 2022-08-10 ENCOUNTER — Ambulatory Visit (INDEPENDENT_AMBULATORY_CARE_PROVIDER_SITE_OTHER): Payer: BC Managed Care – PPO

## 2022-08-10 ENCOUNTER — Ambulatory Visit
Admission: EM | Admit: 2022-08-10 | Discharge: 2022-08-10 | Disposition: A | Discharge status: Home / Self Care | Payer: BC Managed Care – PPO | Attending: Urgent Care | Admitting: Urgent Care

## 2022-08-10 DIAGNOSIS — J189 Pneumonia, unspecified organism: Secondary | ICD-10-CM

## 2022-08-10 LAB — CULTURE, GROUP A STREP (THRC)

## 2022-08-10 LAB — SARS CORONAVIRUS 2 (TAT 6-24 HRS): SARS Coronavirus 2: NEGATIVE

## 2022-08-10 MED ORDER — AMOXICILLIN-POT CLAVULANATE 875-125 MG PO TABS: 1.0000 | ORAL_TABLET | Freq: Two times a day (BID) | ORAL | 0 refills | Status: DC

## 2022-08-10 MED ORDER — PROMETHAZINE-DM 6.25-15 MG/5ML PO SYRP: 5.0000 mL | ORAL_SOLUTION | Freq: Three times a day (TID) | ORAL | 0 refills | Status: DC | PRN

## 2022-08-10 MED ORDER — CETIRIZINE HCL 10 MG PO TABS: 10.0000 mg | ORAL_TABLET | Freq: Every day | ORAL | 0 refills | Status: AC

## 2022-08-10 MED ORDER — AZITHROMYCIN 250 MG PO TABS: ORAL_TABLET | ORAL | 0 refills | Status: DC

## 2022-08-10 MED ORDER — PSEUDOEPHEDRINE HCL 30 MG PO TABS: 30.0000 mg | ORAL_TABLET | Freq: Three times a day (TID) | ORAL | 0 refills | Status: DC | PRN

## 2022-08-10 NOTE — ED Provider Notes (Signed)
[facility]  Note:  This document was prepared using Conservation officer, historic buildings and may include unintentional dictation errors.  MRN: [id number] DOB: [birth year]  Subjective:   Brittany Fletcher is a 57 y.o. female presenting for 6-7 day history of persistent productive cough, fevers, chest congestion and chills.  Has been using supportive care as recommended at her previous visit 2 days ago.  COVID test was negative.  Rapid strep test was negative, culture is pending.  No history of asthma.  No smoking of any kind including cigarettes, cigars, vaping, marijuana use.    No current facility-administered medications for this encounter.  Current Outpatient Medications:    amLODipine (NORVASC) 5 MG tablet, Take 5 mg by mouth daily., Disp: , Rfl: 3   benzonatate (TESSALON) 100 MG capsule, Take 1 capsule (100 mg total) by mouth every 8 (eight) hours as needed for cough., Disp: 21 capsule, Rfl: 0   citalopram (CELEXA) 10 MG tablet, TAKE 1 TABLET BY MOUTH EVERY DAY FOR 10 DAYS,THEN INCREASE TO 2 TABLETS DAILY, Disp: , Rfl:    cyclobenzaprine (FLEXERIL) 5 MG tablet, Take by mouth., Disp: , Rfl:    diclofenac (VOLTAREN) 75 MG EC tablet, TAKE 1 TABLET (75 MG TOTAL) BY MOUTH 2 (TWO) TIMES A DAY FOR 10 DAYS. DO NOT CRUSH, CHEW, OR SPLIT., Disp: , Rfl:    fluticasone (FLONASE) 50 MCG/ACT nasal spray, Place 1 spray into both nostrils daily., Disp: 16 g, Rfl: 0   gabapentin (NEURONTIN) 300 MG capsule, Take 1 capsule by mouth as directed  TAKE ONE TABLET DAILY FOR ONE WEEK; THEN TWO TABLETS DAILY FOR ONE WEEK; THEN THREE TIMES DAILY, Disp: , Rfl:    hydrochlorothiazide (HYDRODIURIL) 12.5 MG tablet, Take 12.5 mg by mouth at bedtime. , Disp: , Rfl: 3   hydrOXYzine (VISTARIL) 25 MG capsule, Take 25 mg by mouth 2 (two) times daily., Disp: , Rfl:    irbesartan (AVAPRO) 150 MG tablet, Take 150 mg by mouth at bedtime. , Disp: , Rfl: 3   lisinopril (ZESTRIL) 10 MG tablet, Take by mouth.,  Disp: , Rfl:    meloxicam (MOBIC) 7.5 MG tablet, Take 7.5 mg by mouth daily., Disp: , Rfl:    methocarbamol (ROBAXIN) 500 MG tablet, Take 500 mg by mouth at bedtime., Disp: , Rfl:    phenol (CHLORASEPTIC) 1.4 % LIQD, Use as directed 1 spray in the mouth or throat as needed for throat irritation / pain., Disp: 118 mL, Rfl: 0   rosuvastatin (CRESTOR) 20 MG tablet, Take 1 tablet (20 mg total) by mouth daily., Disp: 30 tablet, Rfl: 11   valACYclovir (VALTREX) 500 MG tablet, Take 500 mg by mouth 2 (two) times daily., Disp: , Rfl:    valsartan-hydrochlorothiazide (DIOVAN-HCT) 160-12.5 MG tablet, Take 1 tablet by mouth daily., Disp: , Rfl:    No Known Allergies  Past Medical History:  Diagnosis Date   Anxiety disorder    Borderline diabetic    Cervical spine pain    Cervicalgia    Dizziness    H/O urinary infection    Hypertension    Hypertension    Internal carotid artery stenosis    Lumbar spine pain    Numbness and tingling      Past Surgical History:  Procedure Laterality Date   CESAREAN SECTION      Family History  Problem Relation Age of Onset   Breast cancer Neg Hx     Social History   Tobacco Use  Smoking status: Never   Smokeless tobacco: Never  Vaping Use   Vaping Use: Never used  Substance Use Topics   Alcohol use: Not Currently    Comment: very rarely   Drug use: Never    ROS   Objective:   Vitals: BP 119/80 (BP Location: Right Arm)   Pulse 99   Temp 99.7 F (37.6 C) (Oral)   Resp 18   LMP 03/14/2014   SpO2 94%   Physical Exam Constitutional:      General: She is not in acute distress.    Appearance: Normal appearance. She is well-developed and normal weight. She is not ill-appearing, toxic-appearing or diaphoretic.  HENT:     Head: Normocephalic and atraumatic.     Right Ear: Tympanic membrane, ear canal and external ear normal. No drainage or tenderness. No middle ear effusion. There is no impacted cerumen. Tympanic membrane is not  erythematous or bulging.     Left Ear: Tympanic membrane, ear canal and external ear normal. No drainage or tenderness.  No middle ear effusion. There is no impacted cerumen. Tympanic membrane is not erythematous or bulging.     Nose: Nose normal. No congestion or rhinorrhea.     Mouth/Throat:     Mouth: Mucous membranes are moist. No oral lesions.     Pharynx: No pharyngeal swelling, oropharyngeal exudate, posterior oropharyngeal erythema or uvula swelling.     Tonsils: No tonsillar exudate or tonsillar abscesses.  Eyes:     General: No scleral icterus.       Right eye: No discharge.        Left eye: No discharge.     Extraocular Movements: Extraocular movements intact.     Right eye: Normal extraocular motion.     Left eye: Normal extraocular motion.     Conjunctiva/sclera: Conjunctivae normal.  Cardiovascular:     Rate and Rhythm: Normal rate and regular rhythm.     Heart sounds: Normal heart sounds. No murmur heard.    No friction rub. No gallop.  Pulmonary:     Effort: Pulmonary effort is normal. No respiratory distress.     Breath sounds: No stridor. No wheezing, rhonchi or rales.  Chest:     Chest wall: No tenderness.  Musculoskeletal:     Cervical back: Normal range of motion and neck supple.  Lymphadenopathy:     Cervical: No cervical adenopathy.  Skin:    General: Skin is warm and dry.  Neurological:     General: No focal deficit present.     Mental Status: She is alert and oriented to person, place, and time.  Psychiatric:        Mood and Affect: Mood normal.        Behavior: Behavior normal.     DG Chest 2 View  Result Date: 08/10/2022 CLINICAL DATA:  Chest congestion EXAM: CHEST - 2 VIEW COMPARISON:  Chest x-ray dated January 21, 2018 FINDINGS: Cardiac and mediastinal contours within normal limits. New mild left lower lobe opacity. No evidence of pleural effusion or pneumothorax. IMPRESSION: Mild left lower lobe opacity, differential considerations include  atelectasis versus infection or aspiration. Recommend follow-up PA and lateral chest radiograph in 6-8 weeks to ensure resolution. Electronically Signed   By: Allegra Lai M.D.   On: 08/10/2022 10:35     Assessment and Plan :   PDMP not reviewed this encounter.  1. Community acquired pneumonia of left lower lobe of lung    Will cover for community-acquired pneumonia with  Augmentin, azithromycin for double coverage.  Recommended supportive care otherwise.  Repeat x-ray in 4 to 6 weeks.  Counseled patient on potential for adverse effects with medications prescribed/recommended today, ER and return-to-clinic precautions discussed, patient verbalized understanding.    Wallis Bamberg, [place] 08/10/22 1151

## 2022-08-10 NOTE — ED Triage Notes (Signed)
Pt reports cough, fever 101.00 F, chest congestion and chills x 6 days. Taking Tylenol and ibuprofen. Tessalon gives no relief.

## 2022-08-11 LAB — CULTURE, GROUP A STREP (THRC)

## 2022-12-23 IMAGING — MG MM DIGITAL SCREENING BILAT W/ TOMO AND CAD
8 series · 8 of 24 positions shown · non-contrast
Comparison: Previous exam(s).

CLINICAL DATA: Screening.

EXAM:
DIGITAL SCREENING BILATERAL MAMMOGRAM WITH TOMOSYNTHESIS AND CAD
TECHNIQUE: Bilateral screening digital craniocaudal and mediolateral oblique
mammograms were obtained. Bilateral screening digital breast
tomosynthesis was performed. The images were evaluated with
computer-aided detection.

[R MLO synth-2D]
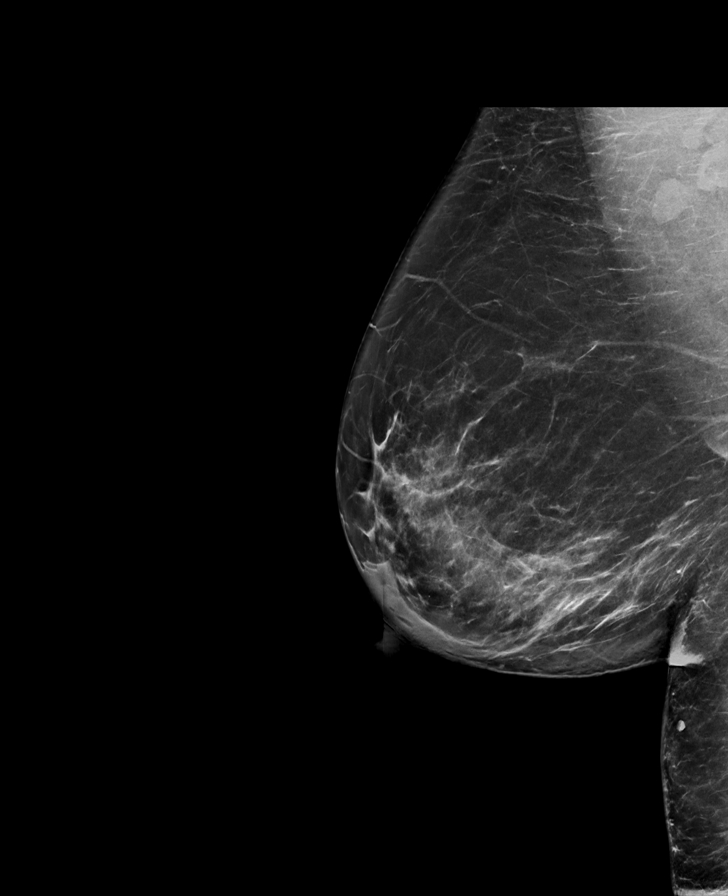

[L CC synth-2D]
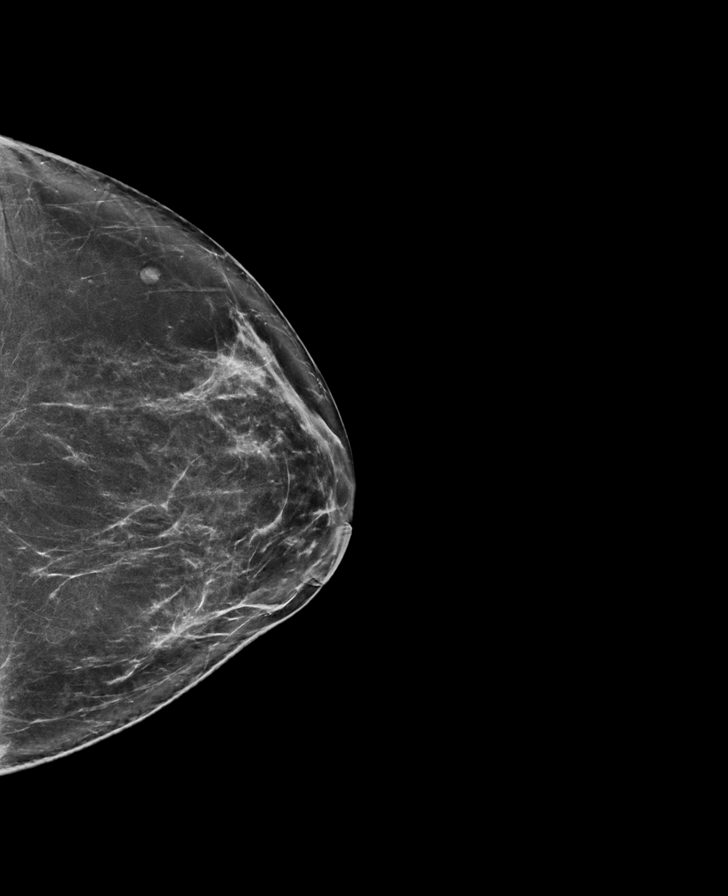

[L MLO synth-2D]
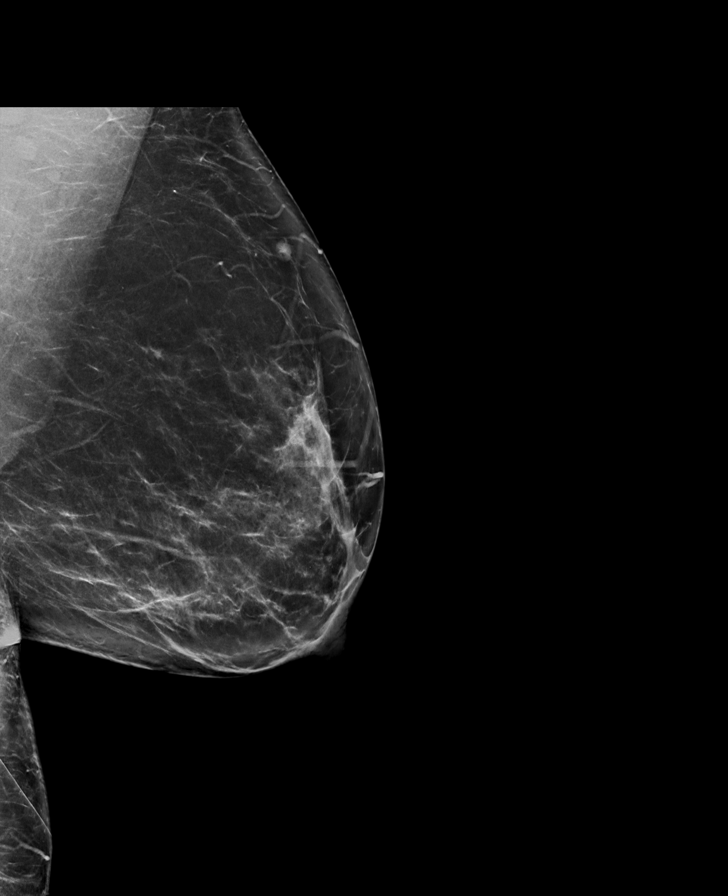

[R CC synth-2D]
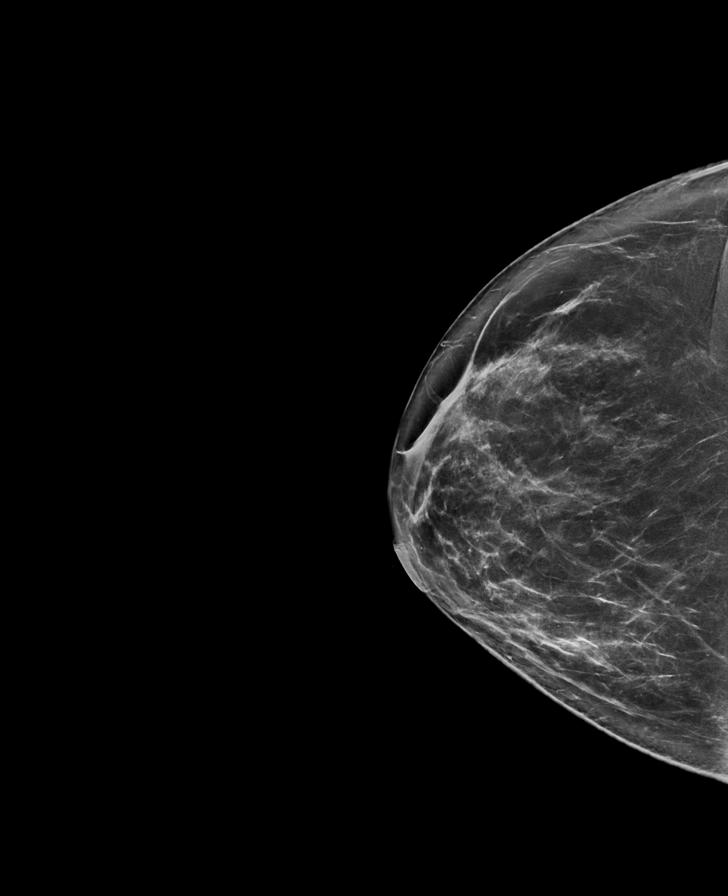

[L MLO tomo · tomo slice 43/86.0]
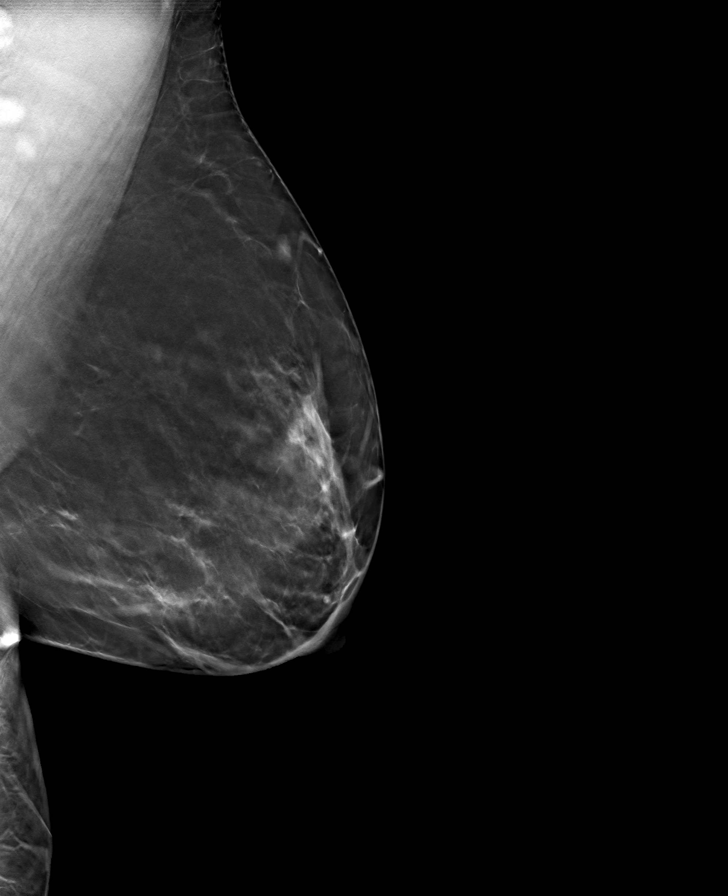

[R CC tomo · tomo slice 43/86.0]
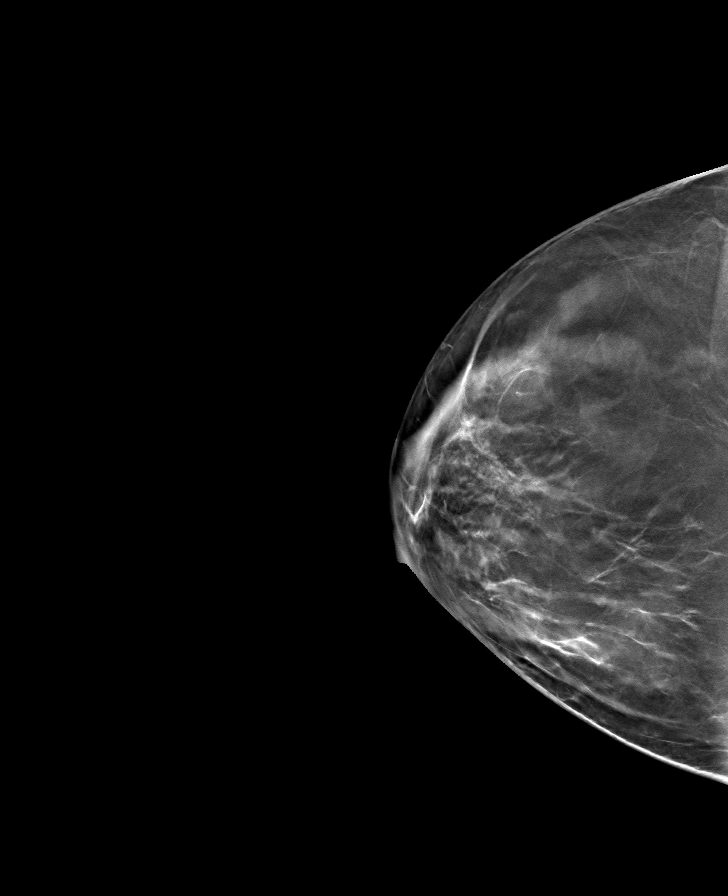

[L CC tomo · tomo slice 43/85.0]
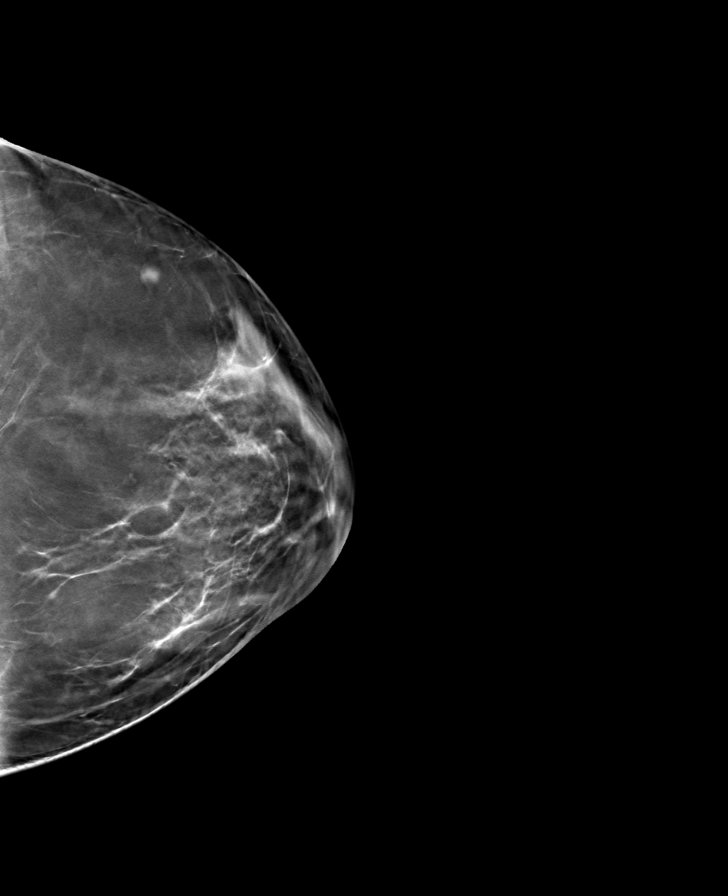

[R MLO tomo · tomo slice 46/91.0]
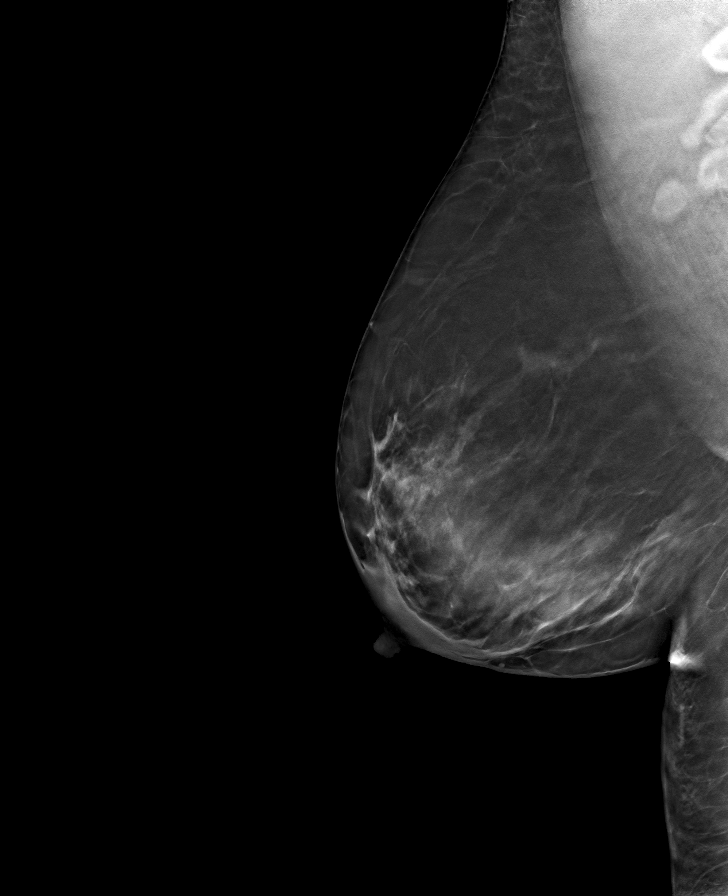

[8 of 24 positions shown; findings below may reference images not displayed]

ACR Breast Density Category b: There are scattered areas of
fibroglandular density.
FINDINGS: There are no findings suspicious for malignancy.
IMPRESSION: No mammographic evidence of malignancy. A result letter of this
screening mammogram will be mailed directly to the patient.

RECOMMENDATION:
Screening mammogram in one year. (Code:51-O-LD2)

BI-RADS CATEGORY  1: Negative.

## 2023-07-03 ENCOUNTER — Ambulatory Visit (INDEPENDENT_AMBULATORY_CARE_PROVIDER_SITE_OTHER)

## 2023-07-03 ENCOUNTER — Ambulatory Visit
Admission: EM | Admit: 2023-07-03 | Discharge: 2023-07-03 | Disposition: A | Discharge status: Home / Self Care | Attending: Family Medicine | Admitting: Family Medicine

## 2023-07-03 DIAGNOSIS — R058 Other specified cough: Secondary | ICD-10-CM

## 2023-07-03 DIAGNOSIS — R0981 Nasal congestion: Secondary | ICD-10-CM | POA: Diagnosis not present

## 2023-07-03 MED ORDER — PROMETHAZINE-DM 6.25-15 MG/5ML PO SYRP: 5.0000 mL | ORAL_SOLUTION | Freq: Every evening | ORAL | 0 refills | Status: AC | PRN

## 2023-07-03 MED ORDER — PREDNISONE 10 MG PO TABS: 30.0000 mg | ORAL_TABLET | Freq: Every day | ORAL | 0 refills | Status: AC

## 2023-07-03 MED ORDER — BENZONATATE 100 MG PO CAPS: 100.0000 mg | ORAL_CAPSULE | Freq: Three times a day (TID) | ORAL | 0 refills | Status: AC | PRN

## 2023-07-03 NOTE — ED Triage Notes (Signed)
 Pt c/o cough, head/chest congestion x 3-4 days-denies fever-taking mucinex and cough drops with some relief-NAD-steady gait

## 2023-07-03 NOTE — ED Provider Notes (Signed)
 [facility]  Note:  This document was prepared using Conservation officer, historic buildings and may include unintentional dictation errors.  MRN: [id number] DOB: 07/06/1965  Subjective:   Brittany Fletcher is a 58 y.o. female presenting for 4-day history of sinus congestion, sinus drainage, persistent dry coughing.  Symptoms started after she did a 7 mile walk.  Prior to that she reports that she felt fine.  For the next day she started with a lot of sneezing and her symptoms progressed to what she reports today.  No chest pain, shob, wheezing. No asthma.  No smoking of any kind including cigarettes, cigars, vaping, marijuana use.  She does have allergies but is not taking her Zyrtec  and Flonase .  No current facility-administered medications for this encounter.  Current Outpatient Medications:    cetirizine  (ZYRTEC  ALLERGY) 10 MG tablet, Take 1 tablet (10 mg total) by mouth daily., Disp: 30 tablet, Rfl: 0   fluticasone  (FLONASE ) 50 MCG/ACT nasal spray, Place 1 spray into both nostrils daily., Disp: 16 g, Rfl: 0   rosuvastatin  (CRESTOR ) 20 MG tablet, Take 1 tablet (20 mg total) by mouth daily., Disp: 30 tablet, Rfl: 11   valACYclovir (VALTREX) 500 MG tablet, Take 500 mg by mouth 2 (two) times daily., Disp: , Rfl:    valsartan-hydrochlorothiazide (DIOVAN-HCT) 160-12.5 MG tablet, Take 1 tablet by mouth daily., Disp: , Rfl:    No Known Allergies  Past Medical History:  Diagnosis Date   Anxiety disorder    Borderline diabetic    Cervical spine pain    Cervicalgia    Dizziness    H/O urinary infection    Hypertension    Hypertension    Internal carotid artery stenosis    Lumbar spine pain    Numbness and tingling      Past Surgical History:  Procedure Laterality Date   CESAREAN SECTION      Family History  Problem Relation Age of Onset   Breast cancer Neg Hx     Social History   Tobacco Use   Smoking status: Never   Smokeless tobacco: Never  Vaping Use    Vaping status: Never Used  Substance Use Topics   Alcohol use: Not Currently    Comment: very rarely   Drug use: Never    ROS   Objective:   Vitals: BP (!) 149/88 (BP Location: Right Arm)   Pulse (!) 103   Temp 98.9 F (37.2 C) (Oral)   Resp 20   LMP 03/14/2014   SpO2 96%   Physical Exam Constitutional:      General: She is not in acute distress.    Appearance: Normal appearance. She is well-developed and normal weight. She is not ill-appearing, toxic-appearing or diaphoretic.  HENT:     Head: Normocephalic and atraumatic.     Right Ear: Tympanic membrane, ear canal and external ear normal. No drainage or tenderness. No middle ear effusion. There is no impacted cerumen. Tympanic membrane is not erythematous or bulging.     Left Ear: Tympanic membrane, ear canal and external ear normal. No drainage or tenderness.  No middle ear effusion. There is no impacted cerumen. Tympanic membrane is not erythematous or bulging.     Nose: Congestion present. No rhinorrhea.     Mouth/Throat:     Mouth: Mucous membranes are moist. No oral lesions.     Pharynx: No pharyngeal swelling, oropharyngeal exudate, posterior oropharyngeal erythema or uvula swelling.     Tonsils: No tonsillar  exudate or tonsillar abscesses.     Comments: Cobblestone pattern postnasal drainage overlying pharynx. Eyes:     General: No scleral icterus.       Right eye: No discharge.        Left eye: No discharge.     Extraocular Movements: Extraocular movements intact.     Right eye: Normal extraocular motion.     Left eye: Normal extraocular motion.     Conjunctiva/sclera: Conjunctivae normal.  Cardiovascular:     Rate and Rhythm: Normal rate and regular rhythm.     Heart sounds: Normal heart sounds. No murmur heard.    No friction rub. No gallop.  Pulmonary:     Effort: Pulmonary effort is normal. No respiratory distress.     Breath sounds: No stridor. No wheezing, rhonchi or rales.  Chest:     Chest wall:  No tenderness.  Musculoskeletal:     Cervical back: Normal range of motion and neck supple.  Lymphadenopathy:     Cervical: No cervical adenopathy.  Skin:    General: Skin is warm and dry.  Neurological:     General: No focal deficit present.     Mental Status: She is alert and oriented to person, place, and time.  Psychiatric:        Mood and Affect: Mood normal.        Behavior: Behavior normal.     Assessment and Plan :   PDMP not reviewed this encounter.  1. Dry cough   2. Sinus congestion    X-ray over-read was pending at time of discharge, recommended follow up with only abnormal results. Otherwise will not call for negative over-read. Patient was in agreement.  Suspect viral upper respiratory infection versus allergic rhinitis or both.  Recommended restarting Zyrtec , will use 30 mg oral prednisone  course for 5 days for her allergic rhinitis flare.  Use supportive care otherwise.  Counseled patient on potential for adverse effects with medications prescribed/recommended today, ER and return-to-clinic precautions discussed, patient verbalized understanding.    Adolph Hoop, [place] 07/03/23 1610

## 2023-07-03 NOTE — Discharge Instructions (Addendum)
 We will update your x-ray results tomorrow. For now start Zyrtec , prednisone  and use cough syrup as needed. Use Tylenol for aches and pains. These measures can address a viral upper respiratory infection, allergies.

## 2023-07-04 ENCOUNTER — Encounter: Payer: Self-pay | Admitting: Urgent Care

## 2023-08-15 ENCOUNTER — Other Ambulatory Visit: Payer: Self-pay | Admitting: Obstetrics & Gynecology

## 2023-08-15 DIAGNOSIS — Z1231 Encounter for screening mammogram for malignant neoplasm of breast: Secondary | ICD-10-CM

## 2023-08-18 ENCOUNTER — Ambulatory Visit
Admission: RE | Admit: 2023-08-18 | Discharge: 2023-08-18 | Disposition: A | Discharge status: Home / Self Care | Source: Ambulatory Visit | Attending: Obstetrics & Gynecology | Admitting: Obstetrics & Gynecology

## 2023-08-18 DIAGNOSIS — Z1231 Encounter for screening mammogram for malignant neoplasm of breast: Secondary | ICD-10-CM
